# Patient Record
Sex: Female | Born: 1960 | Race: Black or African American | Hispanic: No | Marital: Married | Smoking: Never smoker
Health system: Southern US, Community
[De-identification: ages and names within clinical notes are randomized; demographics above are authoritative.]

## PROBLEM LIST (undated history)

## (undated) DIAGNOSIS — D649 Anemia, unspecified: Secondary | ICD-10-CM

## (undated) DIAGNOSIS — M797 Fibromyalgia: Secondary | ICD-10-CM

## (undated) DIAGNOSIS — K219 Gastro-esophageal reflux disease without esophagitis: Secondary | ICD-10-CM

## (undated) DIAGNOSIS — IMO0002 Reserved for concepts with insufficient information to code with codable children: Secondary | ICD-10-CM

## (undated) DIAGNOSIS — R112 Nausea with vomiting, unspecified: Secondary | ICD-10-CM

## (undated) DIAGNOSIS — Z9889 Other specified postprocedural states: Secondary | ICD-10-CM

## (undated) DIAGNOSIS — M329 Systemic lupus erythematosus, unspecified: Secondary | ICD-10-CM

## (undated) DIAGNOSIS — Z8489 Family history of other specified conditions: Secondary | ICD-10-CM

## (undated) HISTORY — PX: BACK SURGERY: SHX140

## (undated) HISTORY — PX: TONSILLECTOMY: SUR1361

## (undated) HISTORY — PX: KNEE SURGERY: SHX244

## (undated) HISTORY — PX: ABDOMINAL HYSTERECTOMY: SHX81

## (undated) HISTORY — DX: Gastro-esophageal reflux disease without esophagitis: K21.9

## (undated) HISTORY — PX: OTHER SURGICAL HISTORY: SHX169

## (undated) HISTORY — PX: CHOLECYSTECTOMY: SHX55

## (undated) HISTORY — PX: NECK SURGERY: SHX720

---

## 1998-11-16 ENCOUNTER — Ambulatory Visit (HOSPITAL_COMMUNITY): Admission: RE | Admit: 1998-11-16 | Discharge: 1998-11-16 | Payer: Self-pay | Admitting: Gastroenterology

## 1999-07-09 ENCOUNTER — Emergency Department (HOSPITAL_COMMUNITY): Admission: EM | Admit: 1999-07-09 | Discharge: 1999-07-09 | Payer: Self-pay | Admitting: Emergency Medicine

## 1999-07-10 ENCOUNTER — Encounter: Payer: Self-pay | Admitting: Emergency Medicine

## 1999-10-19 ENCOUNTER — Encounter: Payer: Self-pay | Admitting: Cardiology

## 1999-10-19 ENCOUNTER — Ambulatory Visit (HOSPITAL_COMMUNITY): Admission: RE | Admit: 1999-10-19 | Discharge: 1999-10-19 | Payer: Self-pay | Admitting: Cardiology

## 2002-04-15 ENCOUNTER — Encounter: Payer: Self-pay | Admitting: Cardiology

## 2002-04-15 ENCOUNTER — Encounter: Admission: RE | Admit: 2002-04-15 | Discharge: 2002-04-15 | Payer: Self-pay | Admitting: Cardiology

## 2002-04-22 ENCOUNTER — Encounter: Payer: Self-pay | Admitting: Cardiology

## 2002-04-22 ENCOUNTER — Ambulatory Visit (HOSPITAL_COMMUNITY): Admission: RE | Admit: 2002-04-22 | Discharge: 2002-04-22 | Payer: Self-pay | Admitting: Cardiology

## 2003-04-29 ENCOUNTER — Other Ambulatory Visit: Admission: RE | Admit: 2003-04-29 | Discharge: 2003-04-29 | Payer: Self-pay | Admitting: Obstetrics and Gynecology

## 2003-10-03 ENCOUNTER — Encounter: Admission: RE | Admit: 2003-10-03 | Discharge: 2003-10-03 | Payer: Self-pay | Admitting: Cardiology

## 2003-11-05 HISTORY — PX: GASTRIC BYPASS: SHX52

## 2004-09-17 ENCOUNTER — Other Ambulatory Visit: Admission: RE | Admit: 2004-09-17 | Discharge: 2004-09-17 | Payer: Self-pay | Admitting: Obstetrics and Gynecology

## 2006-11-04 DIAGNOSIS — S069X9A Unspecified intracranial injury with loss of consciousness of unspecified duration, initial encounter: Secondary | ICD-10-CM

## 2006-11-04 DIAGNOSIS — S069XAA Unspecified intracranial injury with loss of consciousness status unknown, initial encounter: Secondary | ICD-10-CM

## 2006-11-04 HISTORY — DX: Unspecified intracranial injury with loss of consciousness status unknown, initial encounter: S06.9XAA

## 2006-11-04 HISTORY — DX: Unspecified intracranial injury with loss of consciousness of unspecified duration, initial encounter: S06.9X9A

## 2018-12-19 ENCOUNTER — Other Ambulatory Visit: Payer: Self-pay

## 2018-12-19 ENCOUNTER — Emergency Department (HOSPITAL_COMMUNITY)
Admission: EM | Admit: 2018-12-19 | Discharge: 2018-12-19 | Disposition: A | Discharge status: Home / Self Care | Payer: Medicare Other | Attending: Emergency Medicine | Admitting: Emergency Medicine

## 2018-12-19 ENCOUNTER — Encounter: Payer: Self-pay | Admitting: Emergency Medicine

## 2018-12-19 ENCOUNTER — Emergency Department (HOSPITAL_COMMUNITY): Payer: Medicare Other

## 2018-12-19 DIAGNOSIS — R079 Chest pain, unspecified: Secondary | ICD-10-CM | POA: Diagnosis present

## 2018-12-19 DIAGNOSIS — R072 Precordial pain: Secondary | ICD-10-CM | POA: Insufficient documentation

## 2018-12-19 DIAGNOSIS — Z91040 Latex allergy status: Secondary | ICD-10-CM | POA: Insufficient documentation

## 2018-12-19 HISTORY — DX: Fibromyalgia: M79.7

## 2018-12-19 HISTORY — DX: Anemia, unspecified: D64.9

## 2018-12-19 HISTORY — DX: Systemic lupus erythematosus, unspecified: M32.9

## 2018-12-19 HISTORY — DX: Reserved for concepts with insufficient information to code with codable children: IMO0002

## 2018-12-19 LAB — COMPREHENSIVE METABOLIC PANEL
ALT: 14 U/L (ref 0–44)
AST: 26 U/L (ref 15–41)
Albumin: 3.7 g/dL (ref 3.5–5.0)
Alkaline Phosphatase: 134 U/L — ABNORMAL HIGH (ref 38–126)
Anion gap: 7 (ref 5–15)
BUN: 11 mg/dL (ref 6–20)
CALCIUM: 9 mg/dL (ref 8.9–10.3)
CO2: 24 mmol/L (ref 22–32)
Chloride: 107 mmol/L (ref 98–111)
Creatinine, Ser: 0.79 mg/dL (ref 0.44–1.00)
GFR calc Af Amer: >60 mL/min (ref >60)
GFR calc non Af Amer: >60 mL/min (ref >60)
Glucose, Bld: 102 mg/dL — ABNORMAL HIGH (ref 70–99)
Potassium: 4.6 mmol/L (ref 3.5–5.1)
Sodium: 138 mmol/L (ref 135–145)
Total Bilirubin: 0.8 mg/dL (ref 0.3–1.2)
Total Protein: 7.8 g/dL (ref 6.5–8.1)

## 2018-12-19 LAB — URINALYSIS, ROUTINE W REFLEX MICROSCOPIC
Bilirubin Urine: NEGATIVE
Glucose, UA: NEGATIVE mg/dL
Ketones, ur: NEGATIVE mg/dL
Leukocytes,Ua: NEGATIVE
Nitrite: NEGATIVE
Protein, ur: NEGATIVE mg/dL
Specific Gravity, Urine: 1.013 (ref 1.005–1.030)
pH: 6 (ref 5.0–8.0)

## 2018-12-19 LAB — CBC WITH DIFFERENTIAL/PLATELET
Abs Immature Granulocytes: 0.01 10*3/uL (ref 0.00–0.07)
Basophils Absolute: 0 10*3/uL (ref 0.0–0.1)
Basophils Relative: 1 %
EOS PCT: 3 %
Eosinophils Absolute: 0.1 10*3/uL (ref 0.0–0.5)
HCT: 44.9 % (ref 36.0–46.0)
Hemoglobin: 14.2 g/dL (ref 12.0–15.0)
Immature Granulocytes: 0 %
Lymphocytes Relative: 35 %
Lymphs Abs: 1.5 10*3/uL (ref 0.7–4.0)
MCH: 29.8 pg (ref 26.0–34.0)
MCHC: 31.6 g/dL (ref 30.0–36.0)
MCV: 94.3 fL (ref 80.0–100.0)
MONOS PCT: 8 %
Monocytes Absolute: 0.3 10*3/uL (ref 0.1–1.0)
NRBC: 0 % (ref 0.0–0.2)
Neutro Abs: 2.3 10*3/uL (ref 1.7–7.7)
Neutrophils Relative %: 53 %
Platelets: 186 10*3/uL (ref 150–400)
RBC: 4.76 MIL/uL (ref 3.87–5.11)
RDW: 12.8 % (ref 11.5–15.5)
WBC: 4.3 10*3/uL (ref 4.0–10.5)

## 2018-12-19 LAB — D-DIMER, QUANTITATIVE: D-Dimer, Quant: <0.27 ug/mL-FEU (ref 0.00–0.50)

## 2018-12-19 LAB — I-STAT TROPONIN, ED
Troponin i, poc: 0 ng/mL (ref 0.00–0.08)
Troponin i, poc: 0.02 ng/mL (ref 0.00–0.08)

## 2018-12-19 LAB — LIPASE, BLOOD: Lipase: 48 U/L (ref 11–51)

## 2018-12-19 MED ORDER — ASPIRIN 81 MG PO CHEW
324.0000 mg | CHEWABLE_TABLET | Freq: Once | ORAL | Status: AC
  Administered 2018-12-19: 324 mg via ORAL
  Filled 2018-12-19: qty 4

## 2018-12-19 MED ORDER — OMEPRAZOLE 20 MG PO CPDR: 20.0000 mg | DELAYED_RELEASE_CAPSULE | Freq: Every day | ORAL | 0 refills | Status: DC

## 2018-12-19 MED ORDER — LIDOCAINE VISCOUS HCL 2 % MT SOLN
15.0000 mL | Freq: Once | OROMUCOSAL | Status: AC
  Administered 2018-12-19: 15 mL via ORAL
  Filled 2018-12-19: qty 15

## 2018-12-19 MED ORDER — ALUM & MAG HYDROXIDE-SIMETH 200-200-20 MG/5ML PO SUSP
30.0000 mL | Freq: Once | ORAL | Status: AC
  Administered 2018-12-19: 30 mL via ORAL
  Filled 2018-12-19: qty 30

## 2018-12-19 NOTE — Discharge Instructions (Signed)
Your work-up today was overall reassuring.  The blood clot test was negative.  The chest x-ray was reassuring.  Your heart enzymes were negative x2.  Please follow-up with your primary doctor and start taking the Prilosec to help with the reflux we suspect you have.  Please avoid those jalapenos and other spicy foods.  If any symptoms change or worsen, please return to the nearest emergency department.

## 2018-12-19 NOTE — ED Triage Notes (Signed)
Pt presents with c/o centralized chest pain that radiates up the left side of her neck. Pt endorses an episode of "fluttering" in her chest yesterday. Pt endorses pain with swallowing and frequent and painful burps. Pt also endorses intermittent dizziness today.

## 2018-12-19 NOTE — ED Notes (Signed)
Patient verbalizes understanding of discharge instructions. Opportunity for questioning and answers were provided. Armband removed by staff, pt discharged from ED.  

## 2018-12-19 NOTE — ED Provider Notes (Signed)
[facility] EMERGENCY DEPARTMENT Provider Note   CSN: [id number] Arrival date & time: 12/19/18  1116     History   Chief Complaint Chief Complaint  Patient presents with  . Chest Pain    HPI Tara Austin is a 58 y.o. female.  The history is provided by the patient.  Chest Pain  Pain location:  Substernal area Pain quality: aching, burning and sharp   Pain radiates to:  Neck Pain severity:  Moderate Onset quality:  Gradual Duration:  2 days Timing:  Constant Progression:  Waxing and waning Context: breathing and eating   Relieved by:  Nothing Worsened by:  Coughing and deep breathing Ineffective treatments:  None tried Associated symptoms: heartburn, palpitations and shortness of breath   Associated symptoms: no abdominal pain, no altered mental status, no back pain, no cough, no diaphoresis, no fatigue, no fever, no headache, no nausea, no numbness, no syncope, no vomiting and no weakness     Past Medical History:  Diagnosis Date  . Anemia   . Fibromyalgia   . Lupus ([place])     There are no active problems to display for this patient.   Past Surgical History:  Procedure Laterality Date  . ABDOMINAL HYSTERECTOMY     partial     OB History   No obstetric history on file.      Home Medications    Prior to Admission medications   Not on File    Family History No family history on file.  Social History Social History   Tobacco Use  . Smoking status: Not on file  Substance Use Topics  . Alcohol use: Not on file  . Drug use: Not on file     Allergies   Latex   Review of Systems Review of Systems  Constitutional: Negative for chills, diaphoresis, fatigue and fever.  Eyes: Negative for visual disturbance.  Respiratory: Positive for shortness of breath. Negative for cough, choking, chest tightness, wheezing and stridor.   Cardiovascular: Positive for chest pain and palpitations. Negative for leg swelling and syncope.    Gastrointestinal: Positive for heartburn. Negative for abdominal pain, constipation, diarrhea, nausea and vomiting.  Genitourinary: Negative for flank pain.  Musculoskeletal: Negative for back pain.  Skin: Negative for rash and wound.  Neurological: Negative for weakness, light-headedness, numbness and headaches.  Psychiatric/Behavioral: Negative for agitation and confusion.  All other systems reviewed and are negative.    Physical Exam Updated Vital Signs BP (!) 158/91 (BP Location: Left Arm)   Pulse 73   Temp 98.1 F (36.7 C) (Oral)   Resp (!) 23   Ht '5\' 7"'$  (1.702 m)   Wt 79.8 kg   SpO2 98%   BMI 27.57 kg/m   Physical Exam Vitals signs and nursing note reviewed.  Constitutional:      General: She is not in acute distress.    Appearance: She is well-developed. She is not ill-appearing, toxic-appearing or diaphoretic.  HENT:     Head: Normocephalic and atraumatic.  Eyes:     Conjunctiva/sclera: Conjunctivae normal.     Pupils: Pupils are equal, round, and reactive to light.  Neck:     Musculoskeletal: Neck supple.  Cardiovascular:     Rate and Rhythm: Normal rate and regular rhythm.     Heart sounds: No murmur.  Pulmonary:     Effort: Pulmonary effort is normal. No respiratory distress.     Breath sounds: Normal breath sounds. No decreased breath sounds, wheezing, rhonchi or  rales.  Chest:     Chest wall: Tenderness present.  Abdominal:     Palpations: Abdomen is soft.     Tenderness: There is no abdominal tenderness.  Musculoskeletal:     Right lower leg: She exhibits no tenderness. No edema.     Left lower leg: She exhibits no tenderness. No edema.  Skin:    General: Skin is warm and dry.     Capillary Refill: Capillary refill takes less than 2 seconds.  Neurological:     General: No focal deficit present.     Mental Status: She is alert.  Psychiatric:        Mood and Affect: Mood normal.      ED Treatments / Results  Labs (all labs ordered are  listed, but only abnormal results are displayed) Labs Reviewed  COMPREHENSIVE METABOLIC PANEL - Abnormal; Notable for the following components:      Result Value   Glucose, Bld 102 (*)    Alkaline Phosphatase 134 (*)    All other components within normal limits  URINALYSIS, ROUTINE W REFLEX MICROSCOPIC - Abnormal; Notable for the following components:   Hgb urine dipstick MODERATE (*)    Bacteria, UA RARE (*)    All other components within normal limits  CBC WITH DIFFERENTIAL/PLATELET  LIPASE, BLOOD  D-DIMER, QUANTITATIVE (NOT AT [facility])  I-STAT TROPONIN, ED  I-STAT TROPONIN, ED    EKG EKG Interpretation  Date/Time:  Saturday December 19 2018 11:28:20 EST Ventricular Rate:  74 PR Interval:    QRS Duration: 89 QT Interval:  419 QTC Calculation: 465 R Axis:   35 Text Interpretation:  Sinus rhythm Abnormal R-wave progression, early transition Baseline wander in lead(s) II aVF No prior ECG for comparison.  No STEMI Confirmed by Antony Blackbird [phone]) on 12/19/2018 11:33:55 AM   Radiology Dg Chest 2 View  Result Date: 12/19/2018 CLINICAL DATA:  Pt presents with c/o centralized chest pain that radiates up the left side of her neck. Pt endorses an episode of "fluttering" in her chest yesterday. Pt endorses pain with swallowing and frequent and painful burps. Patient also reports dizziness today. History of anemia. EXAM: CHEST - 2 VIEW COMPARISON:  None. FINDINGS: Normal heart, mediastinum and hila. The lungs are clear.  No pleural effusion or pneumothorax. Skeletal structures are unremarkable. IMPRESSION: No active cardiopulmonary disease. Electronically Signed   By: Lajean Manes M.D.   On: 12/19/2018 13:06    Procedures Procedures (including critical care time)  Medications Ordered in ED Medications  alum & mag hydroxide-simeth (MAALOX/MYLANTA) 200-200-20 MG/5ML suspension 30 mL (30 mLs Oral Given 12/19/18 1158)    And  lidocaine (XYLOCAINE) 2 % viscous mouth solution 15 mL (15 mLs  Oral Given 12/19/18 1158)  aspirin chewable tablet 324 mg (324 mg Oral Given 12/19/18 1158)     Initial Impression / Assessment and Plan / ED Course  I have reviewed the triage vital signs and the nursing notes.  Pertinent labs & imaging results that were available during my care of the patient were reviewed by me and considered in my medical decision making (see chart for details).     Tara Austin is a 58 y.o. female with a past medical history significant for fibromyalgia, lupus, and anemia who presents with chest pain for the last 2 days.  Patient reports that yesterday she began having pain in her chest that started.  It radiates towards her neck.  She reports palpitations and fluttering sensation.  She reports shortness  of breath.  She reports it is very pleuritic.  It is not exertional.  She reports associated diaphoresis but no nausea or vomiting.  She is never had this before.  She does report shortness of breath with it.  She reports the pain gets up to 7 out of 10 in severity and is currently slightly improved.  She reports no leg pain or leg swelling.  She does report some chills but minimal cough.  She reports that her mother had early heart attack and she is concerned about cardiac disease.  She does report that she is a history of reflux and ate jalapenos the other day.  On exam, chest is slightly tender to palpation reproducing her discomfort.  No stridor.  Lungs clear.  Abdomen nontender.  Back nontender.  Legs have normal pulses and are nonedematous.  Arms have normal pulses.  No focal neurologic deficits.  EKG shows no STEMI.  Clinically I suspect patient is having a musculoskeletal or reflux type pain after jalapenos.  However, given patient's description of symptoms, will have work-up to look for pulmonary embolism or cardiac etiology of symptoms.  Anticipate reassessment after work-up.  Patient's work-up was reassuring.  Delta troponin was negative.  Urinalysis shows  no infection.  CBC and CMP reassuring.  She reports he does not have a gallbladder and I suspect the alk phos was an acute phase reactant.  Chest x-ray reassuring.  Clinically I suspect the patient symptoms are related to the jalapenos he ingested worsening her prior reflux.  She reports that GI cocktail significantly helped her discomfort.  Patient be started on Prilosec and follow-up with her PCP.  She understand extremely strict return precautions for new or worsened symptoms.  Patient had no other questions or concerns and was discharged in good condition with improved symptoms.  Final Clinical Impressions(s) / ED Diagnoses   Final diagnoses:  Precordial pain  Nonspecific chest pain    ED Discharge Orders         Ordered    omeprazole (PRILOSEC) 20 MG capsule  Daily     12/19/18 1548          Clinical Impression: 1. Precordial pain   2. Nonspecific chest pain     Disposition: Discharge  Condition: Good  I have discussed the results, Dx and Tx plan with the pt(& family if present). He/she/they expressed understanding and agree(s) with the plan. Discharge instructions discussed at great length. Strict return precautions discussed and pt &/or family have verbalized understanding of the instructions. No further questions at time of discharge.    New Prescriptions   OMEPRAZOLE (PRILOSEC) 20 MG CAPSULE    Take 1 capsule (20 mg total) by mouth daily.    Follow Up: [place] [id number] [phone] Schedule an appointment as soon as possible for a visit    [facility] [address] [phone] mc [place]       Tegeler, Gwenyth Allegra, MD 12/19/18 1945

## 2019-10-05 ENCOUNTER — Other Ambulatory Visit: Payer: Self-pay | Admitting: Family

## 2019-10-05 DIAGNOSIS — Z1231 Encounter for screening mammogram for malignant neoplasm of breast: Secondary | ICD-10-CM

## 2019-10-12 ENCOUNTER — Telehealth: Payer: Self-pay

## 2019-10-12 ENCOUNTER — Ambulatory Visit: Payer: Medicare Other | Admitting: Neurology

## 2019-10-12 NOTE — Telephone Encounter (Signed)
Pt did not show for their appt with Dr. Athar today.  

## 2019-10-19 ENCOUNTER — Encounter: Payer: Self-pay | Admitting: Neurology

## 2019-11-09 ENCOUNTER — Ambulatory Visit: Payer: Medicare Other | Admitting: Neurology

## 2019-11-09 ENCOUNTER — Telehealth: Payer: Self-pay

## 2019-11-09 NOTE — Telephone Encounter (Signed)
PT had appt pn 11/09/2019 and cancel on 11/08/2019. This appt is a no show. Pt reschedule for 12/16/2019. IF pt cancels or no show less than 24 hours please do not r/s pt. ITs for a new pt appt.

## 2019-11-25 ENCOUNTER — Ambulatory Visit: Payer: Medicare Other

## 2019-12-16 ENCOUNTER — Ambulatory Visit: Payer: Medicare Other | Admitting: Neurology

## 2019-12-16 ENCOUNTER — Encounter: Payer: Self-pay | Admitting: Neurology

## 2019-12-16 ENCOUNTER — Other Ambulatory Visit: Payer: Self-pay

## 2019-12-16 VITALS — BP 111/80 | HR 69 | Ht 66.0 in | Wt 195.3 lb

## 2019-12-16 DIAGNOSIS — M542 Cervicalgia: Secondary | ICD-10-CM

## 2019-12-16 DIAGNOSIS — G43019 Migraine without aura, intractable, without status migrainosus: Secondary | ICD-10-CM

## 2019-12-16 DIAGNOSIS — M5431 Sciatica, right side: Secondary | ICD-10-CM | POA: Diagnosis not present

## 2019-12-16 DIAGNOSIS — G8929 Other chronic pain: Secondary | ICD-10-CM

## 2019-12-16 MED ORDER — RIZATRIPTAN BENZOATE 10 MG PO TBDP: 10.0000 mg | ORAL_TABLET | ORAL | 5 refills | Status: AC | PRN

## 2019-12-16 NOTE — Progress Notes (Signed)
Subjective:    Patient ID: Tara Austin is a 59 y.o. female.  HPI     Huston Foley, MD, PhD [facility] [address] [address] [place], [place] [address]  Dear Tara Austin,   I saw your patient, [name], upon your kind request in my neurologic clinic today for initial consultation of her recurrent headaches.  The patient is unaccompanied today.  Of note, she missed an appointment on 10/12/2019 as well as 11/09/2019. As you know, [name] is a 59 year old right-handed woman with an underlying medical history of hiatal hernia, reflux disease, history of fibromyalgia, history of lupus, chronic migraines, remote history of traumatic brain injury by chart report, status post spinal surgery, chronic neck pain, vitamin D deficiency, obesity, depression and insomnia, who reports recurrent headaches for the past 12+ years.  She describes one-sided throbbing headaches, associated with photophobia, lying down helps, sometimes she has nausea and vomiting but not every time.  She has a family history of migraines, mother has migraines and her daughter has them.  The patient is separated, moved from [place] in August 2019.  She is on disability, she also has her own travel agency.  She is a non-smoker and drinks alcohol on special occasions only and very limited caffeine, typically only 1 cup of coffee per day.  She tries to hydrate well with water. She has been referred to rheumatology, since her move from [place] she has not seen a rheumatologist, she used to be on Plaquenil for her lupus.  For her migraines she was started on Topamax by her rheumatologist.  She used to take Maxalt under the tongue with reasonably good success and would be willing to try it again.  Topamax has been helpful.  She currently takes 100 mg once daily.  She has had some chronic neck pain, had seen a neurosurgeon in the past, has not seen a spine specialist here yet.  She does have low back  pain as well and radiating pain to the right leg from time to time.   Migraine triggers include weather changes, dehydration, and sleep deprivation.  Some years ago she had a sleep study which did not show any sleep apnea per her report.   I reviewed your office note from 10/04/2019.  She has previously seen a neurologist.  She has been on Topamax for migraine prevention.   Currently she is on Lyrica 75 mg twice daily, Zanaflex 3 times a day as needed, she is on Effexor long-acting 150 mg daily, Xanax as needed, hydroxyzine at bedtime.  She no longer takes Flexeril or gabapentin.  She is status post gastric bypass surgery for obesity and has had some weight fluctuations, recent weight gain.   She reports that she is due for an eye examination, she last saw an eye doctor about 2 years ago.  Her Past Medical History Is Significant For: Past Medical History:  Diagnosis Date  . Anemia   . Brain injury ([facility])   . Fibromyalgia   . GERD (gastroesophageal reflux disease)   . Lupus ([facility])     Her Past Surgical History Is Significant For: Past Surgical History:  Procedure Laterality Date  . ABDOMINAL HYSTERECTOMY     partial  . BACK SURGERY     x2   . CHOLECYSTECTOMY    . GASTRIC BYPASS  2005  . KNEE SURGERY     x2   . NECK SURGERY    . shoulder     right   .  TONSILLECTOMY      Her Family History Is Significant For: Family History  Problem Relation Age of Onset  . Heart Problems Mother   . Heart attack Mother   . Thyroid disease Mother   . Migraines Mother   . Asthma Mother   . High blood pressure Mother   . Diabetes Mother   . High blood pressure Father   . Arthritis Father     Her Social History Is Significant For: Social History   Socioeconomic History  . Marital status: Married    Spouse name: Not on file  . Number of children: Not on file  . Years of education: Not on file  . Highest education level: Not on file  Occupational History  . Not on file  Tobacco Use  .  Smoking status: Never Smoker  . Smokeless tobacco: Never Used  Substance and Sexual Activity  . Alcohol use: Yes    Comment: Occational glass of wine   . Drug use: Never  . Sexual activity: Not on file  Other Topics Concern  . Not on file  Social History Narrative  . Not on file   Social Determinants of Health   Financial Resource Strain:   . Difficulty of Paying Living Expenses: Not on file  Food Insecurity:   . Worried About Programme researcher, broadcasting/film/video in the Last Year: Not on file  . Ran Out of Food in the Last Year: Not on file  Transportation Needs:   . Lack of Transportation (Medical): Not on file  . Lack of Transportation (Non-Medical): Not on file  Physical Activity:   . Days of Exercise per Week: Not on file  . Minutes of Exercise per Session: Not on file  Stress:   . Feeling of Stress : Not on file  Social Connections:   . Frequency of Communication with Friends and Family: Not on file  . Frequency of Social Gatherings with Friends and Family: Not on file  . Attends Religious Services: Not on file  . Active Member of Clubs or Organizations: Not on file  . Attends Banker Meetings: Not on file  . Marital Status: Not on file    Her Allergies Are:  Allergies  Allergen Reactions  . Contrast Media [Iodinated Diagnostic Agents] Hives  . Latex Hives  :   Her Current Medications Are:  Outpatient Encounter Medications as of 12/16/2019  Medication Sig  . ALPRAZolam (XANAX) 0.25 MG tablet Take 0.25 mg by mouth daily as needed for anxiety.  . hydrOXYzine (ATARAX/VISTARIL) 25 MG tablet Take 25 mg by mouth at bedtime.  . pantoprazole (PROTONIX) 40 MG tablet Take 40 mg by mouth daily.  . pregabalin (LYRICA) 75 MG capsule Take 75 mg by mouth 2 (two) times daily.  Marland Kitchen tiZANidine (ZANAFLEX) 4 MG tablet Take 4 mg by mouth every 8 (eight) hours as needed for muscle spasms.  Marland Kitchen topiramate (TOPAMAX) 100 MG tablet Take 100 mg by mouth daily.  Marland Kitchen venlafaxine XR (EFFEXOR XR) 150  MG 24 hr capsule Take 150 mg by mouth daily with breakfast.   . Vitamin D, Ergocalciferol, (DRISDOL) 1.25 MG (50000 UT) CAPS capsule Take 50,000 Units by mouth every 7 (seven) days.   . rizatriptan (MAXALT-MLT) 10 MG disintegrating tablet Take 1 tablet (10 mg total) by mouth as needed for migraine. May repeat in 2 hours if needed  . [DISCONTINUED] cyclobenzaprine (FLEXERIL) 10 MG tablet Take 10 mg by mouth 3 (three) times daily as needed for muscle  spasms.  . [DISCONTINUED] gabapentin (NEURONTIN) 100 MG capsule Take 100 mg by mouth 3 (three) times daily.   . [DISCONTINUED] omeprazole (PRILOSEC) 20 MG capsule Take 1 capsule (20 mg total) by mouth daily. (Patient not taking: Reported on 12/16/2019)   No facility-administered encounter medications on file as of 12/16/2019.  : Review of Systems:  Out of a complete 14 point review of systems, all are reviewed and negative with the exception of these symptoms as listed below:  Review of Systems  Neurological:       Here to discuss worsening migraines, neck pain and nerve pain in right leg.     Objective:  Neurological Exam  Physical Exam Physical Examination:   Vitals:   12/16/19 1433  BP: 111/80  Pulse: 69    General Examination: The patient is a very pleasant 59 y.o. female in no acute distress. She appears well-developed and well-nourished and well groomed.   HEENT: Normocephalic, atraumatic, pupils are equal, round and reactive to light and accommodation. Funduscopic exam is normal with sharp disc margins noted. Extraocular tracking is good without limitation to gaze excursion or nystagmus noted. Normal smooth pursuit is noted. Hearing is grossly intact. Face is symmetric with normal facial animation and normal facial sensation. Speech is clear with no dysarthria noted. There is no hypophonia. There is no lip, neck/head, jaw or voice tremor. Neck is supple with full range of passive and active motion. There are no carotid bruits on  auscultation. Oropharynx exam reveals: mild mouth dryness, adequate dental hygiene. Tongue protrudes centrally and palate elevates symmetrically.  Chest: Clear to auscultation without wheezing, rhonchi or crackles noted.  Heart: S1+S2+0, regular and normal without murmurs, rubs or gallops noted.   Abdomen: Soft, non-tender and non-distended with normal bowel sounds appreciated on auscultation.  Extremities: There is no pitting edema in the distal lower extremities bilaterally. Pedal pulses are intact.  Skin: Warm and dry without trophic changes noted.  Musculoskeletal: exam reveals no obvious joint deformities, tenderness or joint swelling or erythema.   Neurologically:  Mental status: The patient is awake, alert and oriented in all 4 spheres. Her immediate and remote memory, attention, language skills and fund of knowledge are appropriate. There is no evidence of aphasia, agnosia, apraxia or anomia. Speech is clear with normal prosody and enunciation. Thought process is linear. Mood is normal and affect is normal.  Cranial nerves II - XII are as described above under HEENT exam. In addition: shoulder shrug is normal with equal shoulder height noted. Motor exam: Normal bulk, strength and tone is noted. There is no drift, tremor or rebound. Romberg is negative. Reflexes are 2+ throughout. Babinski: Toes are flexor bilaterally. Fine motor skills and coordination: intact with normal finger taps, normal hand movements, normal rapid alternating patting, normal foot taps and normal foot agility.  Cerebellar testing: No dysmetria or intention tremor on finger to nose testing. Heel to shin is unremarkable bilaterally. There is no truncal or gait ataxia.  Sensory exam: intact to light touch, vibration, temperature sense in the upper and lower extremities.  Gait, station and balance: She stands easily. No veering to one side is noted. No leaning to one side is noted. Posture is age-appropriate and stance  is narrow based. Gait shows normal stride length and normal pace. No problems turning are noted. Tandem walk is unremarkable.            Assessment and Plan:  In summary, YLIANA GRAVOIS is a very pleasant 59 y.o.-year old  female with an underlying medical history of hiatal hernia, reflux disease, history of fibromyalgia, history of lupus, chronic migraines, remote history of traumatic brain injury by chart review, status post spinal surgery, chronic neck pain, vitamin D deficiency, obesity, depression and insomnia, who Presents for evaluation of her migraine headaches, she has a several year history of recurrent migraines, current frequency is about 1 week, she has been on Topamax for several years, has done overall quite well with it.  She had seen a neurologist in [facility] and also a rheumatologist and is currently in the process of seeing a new rheumatologist after she moved from [place] to [place].She has a history of neck pain and had neck surgery.  She had seen a neurosurgeon in the past.  She has had some ongoing neck pain and also reports low back pain with occasional radiation to the right leg.  Neurological exam is nonfocal and reassuring today.  I do not believe she needs any new imaging from my end of things.  I think she may benefit from seeing a spine specialist, she is encouraged to talk to you about it.  She is encouraged to get a formal eye examination done.  She did well with Maxalt in the past as needed and we mutually agreed to restart this.For migraine prevention she can continue with Topamax . At the current dose.  She is advised to follow-up routinely with one of our nurse practitioners in 3 months, we talked about headache triggers as well today.  She is advised to stay well-hydrated and well rested.  She is also working on weight loss. I answered all her questions today and she was in agreement.   Thank you very much for allowing me to participate in the care of  this nice patient. If I can be of any further assistance to you please do not hesitate to call me at [phone].  Sincerely,   Huston Foley, MD, PhD

## 2019-12-16 NOTE — Patient Instructions (Addendum)
It sounds like you have ongoing issues with neck pain and sometimes your neck pain triggers her migraines.  You also have had some sciatica type symptoms, I do believe you will benefit from establishing with a spine specialist, you used to see a neurosurgeon in [place], please talk to Fatima Sanger about a referral to a spine specialist such as neurosurgeon or orthopedic surgeon in your area. You have done fairly well with Topamax daily for migraine prevention, you can continue with your prescription.  You have done well in the past with Maxalt under the tongue, I will prescribe Maxalt MLT 10 mg strength as needed. I do not believe you need a brain MRI, you have had brain scans before.  We will try to get records from your neurologist from [place], [place].  I do believe you will benefit from a formal eye examination as it has been a couple of years since her last eye exam.  Please schedule with an eye doctor of your choice.  Please follow-up routinely to see one of our nurse practitioners in 3 months.

## 2020-01-06 ENCOUNTER — Other Ambulatory Visit: Payer: Self-pay | Admitting: Obstetrics and Gynecology

## 2020-01-06 DIAGNOSIS — R928 Other abnormal and inconclusive findings on diagnostic imaging of breast: Secondary | ICD-10-CM

## 2020-01-12 ENCOUNTER — Other Ambulatory Visit: Payer: Self-pay | Admitting: Obstetrics and Gynecology

## 2020-01-12 ENCOUNTER — Ambulatory Visit
Admission: RE | Admit: 2020-01-12 | Discharge: 2020-01-12 | Disposition: A | Discharge status: Home / Self Care | Payer: Medicare Other | Source: Ambulatory Visit | Attending: Obstetrics and Gynecology | Admitting: Obstetrics and Gynecology

## 2020-01-12 ENCOUNTER — Other Ambulatory Visit: Payer: Self-pay

## 2020-01-12 DIAGNOSIS — R928 Other abnormal and inconclusive findings on diagnostic imaging of breast: Secondary | ICD-10-CM

## 2020-01-12 DIAGNOSIS — N6489 Other specified disorders of breast: Secondary | ICD-10-CM

## 2020-02-11 ENCOUNTER — Encounter (HOSPITAL_COMMUNITY): Payer: Self-pay | Admitting: Emergency Medicine

## 2020-02-11 ENCOUNTER — Other Ambulatory Visit: Payer: Self-pay

## 2020-02-11 ENCOUNTER — Emergency Department (HOSPITAL_COMMUNITY)
Admission: EM | Admit: 2020-02-11 | Discharge: 2020-02-11 | Disposition: A | Discharge status: Home / Self Care | Payer: Medicare Other | Attending: Emergency Medicine | Admitting: Emergency Medicine

## 2020-02-11 DIAGNOSIS — R109 Unspecified abdominal pain: Secondary | ICD-10-CM | POA: Diagnosis not present

## 2020-02-11 DIAGNOSIS — Z5321 Procedure and treatment not carried out due to patient leaving prior to being seen by health care provider: Secondary | ICD-10-CM | POA: Insufficient documentation

## 2020-02-11 LAB — COMPREHENSIVE METABOLIC PANEL
ALT: 200 U/L — ABNORMAL HIGH (ref 0–44)
AST: 571 U/L — ABNORMAL HIGH (ref 15–41)
Albumin: 3.6 g/dL (ref 3.5–5.0)
Alkaline Phosphatase: 191 U/L — ABNORMAL HIGH (ref 38–126)
Anion gap: 12 (ref 5–15)
BUN: 16 mg/dL (ref 6–20)
CO2: 24 mmol/L (ref 22–32)
Calcium: 8.9 mg/dL (ref 8.9–10.3)
Chloride: 107 mmol/L (ref 98–111)
Creatinine, Ser: 0.8 mg/dL (ref 0.44–1.00)
GFR calc Af Amer: >60 mL/min (ref >60)
GFR calc non Af Amer: >60 mL/min (ref >60)
Glucose, Bld: 102 mg/dL — ABNORMAL HIGH (ref 70–99)
Potassium: 4 mmol/L (ref 3.5–5.1)
Sodium: 143 mmol/L (ref 135–145)
Total Bilirubin: 0.7 mg/dL (ref 0.3–1.2)
Total Protein: 7.7 g/dL (ref 6.5–8.1)

## 2020-02-11 LAB — URINALYSIS, ROUTINE W REFLEX MICROSCOPIC
Bilirubin Urine: NEGATIVE
Glucose, UA: NEGATIVE mg/dL
Hgb urine dipstick: NEGATIVE
Ketones, ur: NEGATIVE mg/dL
Leukocytes,Ua: NEGATIVE
Nitrite: NEGATIVE
Protein, ur: 30 mg/dL — AB
Specific Gravity, Urine: 1.023 (ref 1.005–1.030)
pH: 8 (ref 5.0–8.0)

## 2020-02-11 LAB — CBC
HCT: 43.8 % (ref 36.0–46.0)
Hemoglobin: 13.6 g/dL (ref 12.0–15.0)
MCH: 29.9 pg (ref 26.0–34.0)
MCHC: 31.1 g/dL (ref 30.0–36.0)
MCV: 96.3 fL (ref 80.0–100.0)
Platelets: 216 10*3/uL (ref 150–400)
RBC: 4.55 MIL/uL (ref 3.87–5.11)
RDW: 13.6 % (ref 11.5–15.5)
WBC: 5.9 10*3/uL (ref 4.0–10.5)
nRBC: 0 % (ref 0.0–0.2)

## 2020-02-11 LAB — LIPASE, BLOOD: Lipase: 46 U/L (ref 11–51)

## 2020-02-11 MED ORDER — SODIUM CHLORIDE 0.9% FLUSH: 3.0000 mL | Freq: Once | INTRAVENOUS | Status: DC

## 2020-02-11 NOTE — ED Notes (Signed)
Pt stated that the wait was too long and left.

## 2020-02-11 NOTE — ED Triage Notes (Signed)
Patient reports emesis x2 with upper/mid abdominal pain this afternoon after taking Tylenol with Codeine for her root canal pain . No fever or diarrhea . Patient suspects medication side effect . Airway intact/respirations unlabored.

## 2020-03-01 NOTE — H&P (Signed)
Subjective:     Patient ID: Tara Austin is a 59 y.o. female.  Follow-up  Here for follow up discussion prior to planned panniculectomy. Highest weight 255 lb. Underwent gastric bypass through [facility] 2006. Lowest weight 171 lb. Stable within 10 lbs of current for over year. Reports starting 2011 rashes beneath skin fold abdomen. PMH significant for significant trauma with shelving falling on her in [place] and underwent spinal surgery as a result. Following spinal surgery she first developed symptoms. Has tried guaze, baking soda, hygiene measures, topical nystatin and Nizoral and oral antibiotics. Despite this reports occurrence rashes on average every 2 months, worse in warm weather. Reports excoriations/ulcerations skin even in absence of infection due to traumatized skin. Reports interferes with activities such as exercise, walking, bending, housework.  Patient lives alone. Has family and friend in area to assist with care post op. Disabled secondary to above accident with resultant pain, HA. 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  PMH includes fibromyalgia, lupus her Rheumatologist is in [facility] where she previously lived. Was taken off Plaquenil 3 years ago and stable off this.  Review of Systems    Objective:   Physical Exam  Cardiovascular: Normal rate, regular rhythm and normal heart sounds.  Pulmonary/Chest: Effort normal and breath sounds normal.  Abdominal: Soft.  Panniculus present that hangs below pubic bone, excoriation present beneath fold  Multiple laparoscopic scars, no hernia  Skin:  Luan Pulling 6       Assessment:     Panniculitis S/p gastric bypass    Plan:     Recurrent panniculitis following weight loss that has failed conservative measuresincluding antifungal and antibacterial and steroid treatment. Functional impairment of benign, walking, housework reported. Weight stable >12 months following bariatric  surgery.Panniculectomy is expected to reasonably control this problem.   Reviewedpanniculectomy is not a cosmetic procedure. Reviewed changes with aging, wt gain/loss, will not have as taught skin given significant loss elasticity. Reviewed incisions. Counseled will receive some elevation mons with procedure but will not affect contour of this. Reviewed supraumbilical soft tissue thickness/protuberance  will not change with panniculectomy and some patients experience increase in pant size following operation due to this area.Reviewed overnight stay, drains, post op limitations.   Additional risks including but not limited to bleeding, hematoma, seroma, damage to adjacent structures, blood clots in legs or lungs reviewed.    Discussed risk COVID infectionthrough this elective surgery. Patient will receive COVID testing prior to surgery. Discussed even if patient receivesa negative test result, the tests in some cases may fail to detect the virus or patient maycontract COVID after the test.COVID 19 infectionbefore/during/aftersurgery may result in lead to a higher chance of complication and death.  Rx for oxycodone given. Drain teaching done.

## 2020-03-10 ENCOUNTER — Encounter (HOSPITAL_BASED_OUTPATIENT_CLINIC_OR_DEPARTMENT_OTHER): Payer: Self-pay | Admitting: Plastic Surgery

## 2020-03-10 ENCOUNTER — Other Ambulatory Visit: Payer: Self-pay

## 2020-03-14 ENCOUNTER — Other Ambulatory Visit (HOSPITAL_COMMUNITY)
Admission: RE | Admit: 2020-03-14 | Discharge: 2020-03-14 | Disposition: A | Discharge status: Home / Self Care | Payer: Medicare Other | Source: Ambulatory Visit | Attending: Plastic Surgery | Admitting: Plastic Surgery

## 2020-03-14 DIAGNOSIS — Z01812 Encounter for preprocedural laboratory examination: Secondary | ICD-10-CM | POA: Diagnosis present

## 2020-03-14 DIAGNOSIS — Z20822 Contact with and (suspected) exposure to covid-19: Secondary | ICD-10-CM | POA: Diagnosis not present

## 2020-03-14 LAB — SARS CORONAVIRUS 2 (TAT 6-24 HRS): SARS Coronavirus 2: NEGATIVE

## 2020-03-14 NOTE — Progress Notes (Signed)
Pt arrived for surgical wash dispensed. Instructions given and pt verbalized understanding of usage

## 2020-03-16 ENCOUNTER — Ambulatory Visit: Payer: Medicare Other | Admitting: Family Medicine

## 2020-03-16 NOTE — Anesthesia Preprocedure Evaluation (Addendum)
Anesthesia Evaluation  Patient identified by MRN, date of birth, ID band Patient awake    Reviewed: Allergy & Precautions, NPO status , Patient's Chart, lab work & pertinent test results  History of Anesthesia Complications (+) PONV  Airway Mallampati: II  TM Distance: >3 FB Neck ROM: Full    Dental no notable dental hx. (+) Teeth Intact, Dental Advisory Given   Pulmonary neg pulmonary ROS,    Pulmonary exam normal breath sounds clear to auscultation       Cardiovascular negative cardio ROS Normal cardiovascular exam Rhythm:Regular Rate:Normal     Neuro/Psych  Neuromuscular disease negative neurological ROS  negative psych ROS   GI/Hepatic Neg liver ROS, GERD  Medicated,  Endo/Other  negative endocrine ROS  Renal/GU negative Renal ROS     Musculoskeletal  (+) Arthritis , Fibromyalgia -  Abdominal   Peds  Hematology   Anesthesia Other Findings   Reproductive/Obstetrics                            Anesthesia Physical Anesthesia Plan  ASA: II  Anesthesia Plan: General   Post-op Pain Management:    Induction: Intravenous  PONV Risk Score and Plan: 4 or greater and Treatment may vary due to age or medical condition, Ondansetron and Dexamethasone  Airway Management Planned: Oral ETT  Additional Equipment: None  Intra-op Plan:   Post-operative Plan: Extubation in OR  Informed Consent: I have reviewed the patients History and Physical, chart, labs and discussed the procedure including the risks, benefits and alternatives for the proposed anesthesia with the patient or authorized representative who has indicated his/her understanding and acceptance.     Dental advisory given  Plan Discussed with: CRNA  Anesthesia Plan Comments:        Anesthesia Quick Evaluation

## 2020-03-17 ENCOUNTER — Other Ambulatory Visit: Payer: Self-pay

## 2020-03-17 ENCOUNTER — Ambulatory Visit (HOSPITAL_BASED_OUTPATIENT_CLINIC_OR_DEPARTMENT_OTHER): Payer: Medicare Other | Admitting: Anesthesiology

## 2020-03-17 ENCOUNTER — Encounter (HOSPITAL_BASED_OUTPATIENT_CLINIC_OR_DEPARTMENT_OTHER): Payer: Self-pay | Admitting: Plastic Surgery

## 2020-03-17 ENCOUNTER — Ambulatory Visit (HOSPITAL_BASED_OUTPATIENT_CLINIC_OR_DEPARTMENT_OTHER)
Admission: RE | Admit: 2020-03-17 | Discharge: 2020-03-18 | Disposition: A | Discharge status: Home / Self Care | Payer: Medicare Other | Attending: Plastic Surgery | Admitting: Plastic Surgery

## 2020-03-17 ENCOUNTER — Encounter (HOSPITAL_BASED_OUTPATIENT_CLINIC_OR_DEPARTMENT_OTHER)
Admission: RE | Disposition: A | Discharge status: Home / Self Care | Payer: Self-pay | Source: Home / Self Care | Attending: Plastic Surgery

## 2020-03-17 DIAGNOSIS — M1990 Unspecified osteoarthritis, unspecified site: Secondary | ICD-10-CM | POA: Diagnosis not present

## 2020-03-17 DIAGNOSIS — Z9884 Bariatric surgery status: Secondary | ICD-10-CM | POA: Insufficient documentation

## 2020-03-17 DIAGNOSIS — M793 Panniculitis, unspecified: Secondary | ICD-10-CM | POA: Diagnosis not present

## 2020-03-17 HISTORY — DX: Nausea with vomiting, unspecified: Z98.890

## 2020-03-17 HISTORY — DX: Family history of other specified conditions: Z84.89

## 2020-03-17 HISTORY — DX: Nausea with vomiting, unspecified: R11.2

## 2020-03-17 HISTORY — PX: PANNICULECTOMY: SHX5360

## 2020-03-17 SURGERY — PANNICULECTOMY
Anesthesia: General | Site: Abdomen

## 2020-03-17 MED ORDER — LIDOCAINE HCL (CARDIAC) PF 100 MG/5ML IV SOSY
PREFILLED_SYRINGE | INTRAVENOUS | Status: DC | PRN
  Administered 2020-03-17: 100 mg via INTRAVENOUS

## 2020-03-17 MED ORDER — HYDROMORPHONE HCL 1 MG/ML IJ SOLN
0.2500 mg | INTRAMUSCULAR | Status: DC | PRN
  Administered 2020-03-17 (×2): 0.5 mg via INTRAVENOUS

## 2020-03-17 MED ORDER — EPHEDRINE SULFATE-NACL 50-0.9 MG/10ML-% IV SOSY
PREFILLED_SYRINGE | INTRAVENOUS | Status: DC | PRN
  Administered 2020-03-17 (×2): 15 mg via INTRAVENOUS

## 2020-03-17 MED ORDER — KETOROLAC TROMETHAMINE 30 MG/ML IJ SOLN
30.0000 mg | Freq: Three times a day (TID) | INTRAMUSCULAR | Status: AC
  Administered 2020-03-17 – 2020-03-18 (×3): 30 mg via INTRAVENOUS
  Filled 2020-03-17 (×3): qty 1

## 2020-03-17 MED ORDER — CEFAZOLIN SODIUM-DEXTROSE 2-4 GM/100ML-% IV SOLN
INTRAVENOUS | Status: AC
  Filled 2020-03-17: qty 100

## 2020-03-17 MED ORDER — DEXAMETHASONE SODIUM PHOSPHATE 10 MG/ML IJ SOLN
INTRAMUSCULAR | Status: DC | PRN
  Administered 2020-03-17: 10 mg via INTRAVENOUS

## 2020-03-17 MED ORDER — MIDAZOLAM HCL 5 MG/5ML IJ SOLN
INTRAMUSCULAR | Status: DC | PRN
  Administered 2020-03-17: 2 mg via INTRAVENOUS

## 2020-03-17 MED ORDER — SODIUM CHLORIDE (PF) 0.9 % IJ SOLN
INTRAMUSCULAR | Status: AC
  Filled 2020-03-17: qty 20

## 2020-03-17 MED ORDER — HEPARIN SODIUM (PORCINE) 5000 UNIT/ML IJ SOLN
5000.0000 [IU] | Freq: Once | INTRAMUSCULAR | Status: AC
  Administered 2020-03-17: 5000 [IU] via SUBCUTANEOUS

## 2020-03-17 MED ORDER — KETOROLAC TROMETHAMINE 30 MG/ML IJ SOLN: 30.0000 mg | Freq: Once | INTRAMUSCULAR | Status: AC | PRN

## 2020-03-17 MED ORDER — OXYCODONE HCL 5 MG PO TABS
5.0000 mg | ORAL_TABLET | ORAL | Status: DC | PRN
  Administered 2020-03-17: 5 mg via ORAL
  Filled 2020-03-17: qty 1

## 2020-03-17 MED ORDER — PROPOFOL 10 MG/ML IV BOLUS
INTRAVENOUS | Status: DC | PRN
  Administered 2020-03-17: 160 mg via INTRAVENOUS

## 2020-03-17 MED ORDER — OXYCODONE HCL 5 MG PO TABS: 5.0000 mg | ORAL_TABLET | Freq: Once | ORAL | Status: DC | PRN

## 2020-03-17 MED ORDER — METOCLOPRAMIDE HCL 5 MG/ML IJ SOLN
INTRAMUSCULAR | Status: AC
  Filled 2020-03-17: qty 2

## 2020-03-17 MED ORDER — FENTANYL CITRATE (PF) 100 MCG/2ML IJ SOLN
INTRAMUSCULAR | Status: AC
  Filled 2020-03-17: qty 2

## 2020-03-17 MED ORDER — ROCURONIUM BROMIDE 10 MG/ML (PF) SYRINGE
PREFILLED_SYRINGE | INTRAVENOUS | Status: AC
  Filled 2020-03-17: qty 10

## 2020-03-17 MED ORDER — CEFAZOLIN SODIUM-DEXTROSE 2-4 GM/100ML-% IV SOLN
2.0000 g | INTRAVENOUS | Status: AC
  Administered 2020-03-17: 2 g via INTRAVENOUS

## 2020-03-17 MED ORDER — ONDANSETRON HCL 4 MG/2ML IJ SOLN
INTRAMUSCULAR | Status: AC
  Filled 2020-03-17: qty 2

## 2020-03-17 MED ORDER — DEXMEDETOMIDINE HCL 200 MCG/2ML IV SOLN
INTRAVENOUS | Status: DC | PRN
  Administered 2020-03-17 (×2): 8 ug via INTRAVENOUS

## 2020-03-17 MED ORDER — SUGAMMADEX SODIUM 200 MG/2ML IV SOLN
INTRAVENOUS | Status: DC | PRN
  Administered 2020-03-17: 200 mg via INTRAVENOUS

## 2020-03-17 MED ORDER — FENTANYL CITRATE (PF) 100 MCG/2ML IJ SOLN
INTRAMUSCULAR | Status: DC | PRN
  Administered 2020-03-17: 50 ug via INTRAVENOUS
  Administered 2020-03-17: 100 ug via INTRAVENOUS
  Administered 2020-03-17 (×2): 50 ug via INTRAVENOUS

## 2020-03-17 MED ORDER — KETOROLAC TROMETHAMINE 30 MG/ML IJ SOLN
INTRAMUSCULAR | Status: AC
  Filled 2020-03-17: qty 1

## 2020-03-17 MED ORDER — ENOXAPARIN SODIUM 40 MG/0.4ML ~~LOC~~ SOLN
40.0000 mg | SUBCUTANEOUS | Status: DC
  Administered 2020-03-18: 40 mg via SUBCUTANEOUS
  Filled 2020-03-17: qty 0.4

## 2020-03-17 MED ORDER — ROCURONIUM BROMIDE 100 MG/10ML IV SOLN
INTRAVENOUS | Status: DC | PRN
  Administered 2020-03-17: 50 mg via INTRAVENOUS
  Administered 2020-03-17: 10 mg via INTRAVENOUS
  Administered 2020-03-17: 30 mg via INTRAVENOUS

## 2020-03-17 MED ORDER — PANTOPRAZOLE SODIUM 40 MG PO TBEC: 40.0000 mg | DELAYED_RELEASE_TABLET | Freq: Every day | ORAL | Status: DC

## 2020-03-17 MED ORDER — HYDROMORPHONE HCL 1 MG/ML IJ SOLN: 0.5000 mg | INTRAMUSCULAR | Status: DC | PRN

## 2020-03-17 MED ORDER — OXYCODONE HCL 5 MG/5ML PO SOLN: 5.0000 mg | Freq: Once | ORAL | Status: DC | PRN

## 2020-03-17 MED ORDER — PROPOFOL 10 MG/ML IV BOLUS
INTRAVENOUS | Status: AC
  Filled 2020-03-17: qty 20

## 2020-03-17 MED ORDER — METOCLOPRAMIDE HCL 5 MG/ML IJ SOLN
INTRAMUSCULAR | Status: DC | PRN
  Administered 2020-03-17: 10 mg via INTRAVENOUS

## 2020-03-17 MED ORDER — BUPIVACAINE LIPOSOME 1.3 % IJ SUSP
INTRAMUSCULAR | Status: AC
  Filled 2020-03-17: qty 20

## 2020-03-17 MED ORDER — MIDAZOLAM HCL 2 MG/2ML IJ SOLN: 1.0000 mg | INTRAMUSCULAR | Status: DC | PRN

## 2020-03-17 MED ORDER — VENLAFAXINE HCL ER 150 MG PO CP24
150.0000 mg | ORAL_CAPSULE | Freq: Every day | ORAL | Status: DC
  Filled 2020-03-17: qty 1

## 2020-03-17 MED ORDER — CHLORHEXIDINE GLUCONATE CLOTH 2 % EX PADS: 6.0000 | MEDICATED_PAD | Freq: Once | CUTANEOUS | Status: DC

## 2020-03-17 MED ORDER — HEPARIN SODIUM (PORCINE) 5000 UNIT/ML IJ SOLN
INTRAMUSCULAR | Status: AC
  Filled 2020-03-17: qty 1

## 2020-03-17 MED ORDER — RIZATRIPTAN BENZOATE 10 MG PO TBDP
10.0000 mg | ORAL_TABLET | ORAL | Status: DC | PRN
  Filled 2020-03-17: qty 1

## 2020-03-17 MED ORDER — PREGABALIN 75 MG PO CAPS
75.0000 mg | ORAL_CAPSULE | Freq: Two times a day (BID) | ORAL | Status: DC
  Filled 2020-03-17 (×2): qty 1

## 2020-03-17 MED ORDER — TOPIRAMATE 100 MG PO TABS
100.0000 mg | ORAL_TABLET | Freq: Every day | ORAL | Status: DC
  Filled 2020-03-17: qty 1

## 2020-03-17 MED ORDER — ACETAMINOPHEN 500 MG PO TABS
ORAL_TABLET | ORAL | Status: AC
  Filled 2020-03-17: qty 2

## 2020-03-17 MED ORDER — TIZANIDINE HCL 4 MG PO TABS
4.0000 mg | ORAL_TABLET | Freq: Three times a day (TID) | ORAL | Status: DC | PRN
  Filled 2020-03-17: qty 1

## 2020-03-17 MED ORDER — ONDANSETRON HCL 4 MG/2ML IJ SOLN: 4.0000 mg | Freq: Once | INTRAMUSCULAR | Status: DC | PRN

## 2020-03-17 MED ORDER — HYDROMORPHONE HCL 1 MG/ML IJ SOLN
INTRAMUSCULAR | Status: AC
  Filled 2020-03-17: qty 0.5

## 2020-03-17 MED ORDER — DEXAMETHASONE SODIUM PHOSPHATE 10 MG/ML IJ SOLN
INTRAMUSCULAR | Status: AC
  Filled 2020-03-17: qty 1

## 2020-03-17 MED ORDER — GABAPENTIN 300 MG PO CAPS
300.0000 mg | ORAL_CAPSULE | ORAL | Status: AC
  Administered 2020-03-17: 300 mg via ORAL

## 2020-03-17 MED ORDER — CELECOXIB 200 MG PO CAPS
200.0000 mg | ORAL_CAPSULE | ORAL | Status: AC
  Administered 2020-03-17: 200 mg via ORAL

## 2020-03-17 MED ORDER — ONDANSETRON 4 MG PO TBDP: 4.0000 mg | ORAL_TABLET | Freq: Four times a day (QID) | ORAL | Status: DC | PRN

## 2020-03-17 MED ORDER — BUPIVACAINE HCL (PF) 0.5 % IJ SOLN
INTRAMUSCULAR | Status: AC
  Filled 2020-03-17: qty 30

## 2020-03-17 MED ORDER — 0.9 % SODIUM CHLORIDE (POUR BTL) OPTIME
TOPICAL | Status: DC | PRN
  Administered 2020-03-17: 1000 mL

## 2020-03-17 MED ORDER — LACTATED RINGERS IV SOLN: INTRAVENOUS | Status: DC | PRN

## 2020-03-17 MED ORDER — FENTANYL CITRATE (PF) 100 MCG/2ML IJ SOLN: 50.0000 ug | INTRAMUSCULAR | Status: DC | PRN

## 2020-03-17 MED ORDER — CELECOXIB 200 MG PO CAPS
ORAL_CAPSULE | ORAL | Status: AC
  Filled 2020-03-17: qty 1

## 2020-03-17 MED ORDER — SODIUM CHLORIDE 0.9 % IV SOLN
INTRAVENOUS | Status: DC | PRN
  Administered 2020-03-17: 40 mL

## 2020-03-17 MED ORDER — LACTATED RINGERS IV SOLN: INTRAVENOUS | Status: DC

## 2020-03-17 MED ORDER — ONDANSETRON HCL 4 MG/2ML IJ SOLN: 4.0000 mg | Freq: Four times a day (QID) | INTRAMUSCULAR | Status: DC | PRN

## 2020-03-17 MED ORDER — ALPRAZOLAM 0.25 MG PO TABS: 0.2500 mg | ORAL_TABLET | Freq: Every day | ORAL | Status: DC | PRN

## 2020-03-17 MED ORDER — SCOPOLAMINE 1 MG/3DAYS TD PT72
1.0000 | MEDICATED_PATCH | TRANSDERMAL | Status: DC
  Administered 2020-03-17: 1.5 mg via TRANSDERMAL

## 2020-03-17 MED ORDER — KCL IN DEXTROSE-NACL 20-5-0.45 MEQ/L-%-% IV SOLN
INTRAVENOUS | Status: DC
  Filled 2020-03-17: qty 1000

## 2020-03-17 MED ORDER — ONDANSETRON HCL 4 MG/2ML IJ SOLN
INTRAMUSCULAR | Status: DC | PRN
  Administered 2020-03-17 (×2): 4 mg via INTRAVENOUS

## 2020-03-17 MED ORDER — GABAPENTIN 300 MG PO CAPS
ORAL_CAPSULE | ORAL | Status: AC
  Filled 2020-03-17: qty 1

## 2020-03-17 MED ORDER — EPHEDRINE 5 MG/ML INJ
INTRAVENOUS | Status: AC
  Filled 2020-03-17: qty 10

## 2020-03-17 MED ORDER — ACETAMINOPHEN 500 MG PO TABS
1000.0000 mg | ORAL_TABLET | ORAL | Status: AC
  Administered 2020-03-17: 1000 mg via ORAL

## 2020-03-17 MED ORDER — HYDROXYZINE HCL 25 MG PO TABS
25.0000 mg | ORAL_TABLET | Freq: Every day | ORAL | Status: DC
  Administered 2020-03-17: 25 mg via ORAL
  Filled 2020-03-17: qty 1

## 2020-03-17 MED ORDER — MIDAZOLAM HCL 2 MG/2ML IJ SOLN
INTRAMUSCULAR | Status: AC
  Filled 2020-03-17: qty 2

## 2020-03-17 MED ORDER — LIDOCAINE 2% (20 MG/ML) 5 ML SYRINGE
INTRAMUSCULAR | Status: AC
  Filled 2020-03-17: qty 5

## 2020-03-17 MED ORDER — SCOPOLAMINE 1 MG/3DAYS TD PT72
MEDICATED_PATCH | TRANSDERMAL | Status: AC
  Filled 2020-03-17: qty 1

## 2020-03-17 SURGICAL SUPPLY — 61 items
ADH SKN CLS APL DERMABOND .7 (GAUZE/BANDAGES/DRESSINGS) ×3
APL PRP STRL LF DISP 70% ISPRP (MISCELLANEOUS) ×1
APPLIER CLIP 9.375 MED OPEN (MISCELLANEOUS) ×2
APR CLP MED 9.3 20 MLT OPN (MISCELLANEOUS) ×1
BINDER ABDOMINAL 10 UNV 27-48 (MISCELLANEOUS) IMPLANT
BINDER ABDOMINAL 12 SM 30-45 (SOFTGOODS) IMPLANT
BLADE CLIPPER SURG (BLADE) ×1 IMPLANT
BLADE SURG 10 STRL SS (BLADE) ×4 IMPLANT
BLADE SURG 11 STRL SS (BLADE) ×2 IMPLANT
BLADE SURG 15 STRL LF DISP TIS (BLADE) IMPLANT
BLADE SURG 15 STRL SS (BLADE) ×2
CANISTER SUCT 1200ML W/VALVE (MISCELLANEOUS) ×2 IMPLANT
CHLORAPREP W/TINT 26 (MISCELLANEOUS) ×2 IMPLANT
CLIP APPLIE 9.375 MED OPEN (MISCELLANEOUS) ×1 IMPLANT
COVER BACK TABLE 60X90IN (DRAPES) ×2 IMPLANT
COVER MAYO STAND STRL (DRAPES) ×2 IMPLANT
COVER WAND RF STERILE (DRAPES) IMPLANT
DERMABOND ADVANCED (GAUZE/BANDAGES/DRESSINGS) ×3
DERMABOND ADVANCED .7 DNX12 (GAUZE/BANDAGES/DRESSINGS) ×2 IMPLANT
DRAIN CHANNEL 15F RND FF W/TCR (WOUND CARE) ×4 IMPLANT
DRAIN CHANNEL 19F RND (DRAIN) IMPLANT
DRAPE TOP ARMCOVERS (MISCELLANEOUS) ×2 IMPLANT
DRAPE U-SHAPE 76X120 STRL (DRAPES) ×2 IMPLANT
DRAPE UTILITY XL STRL (DRAPES) ×2 IMPLANT
DRSG PAD ABDOMINAL 8X10 ST (GAUZE/BANDAGES/DRESSINGS) ×6 IMPLANT
ELECT BLADE 4.0 EZ CLEAN MEGAD (MISCELLANEOUS)
ELECT COATED BLADE 2.86 ST (ELECTRODE) IMPLANT
ELECT REM PT RETURN 9FT ADLT (ELECTROSURGICAL) ×2
ELECTRODE BLDE 4.0 EZ CLN MEGD (MISCELLANEOUS) IMPLANT
ELECTRODE REM PT RTRN 9FT ADLT (ELECTROSURGICAL) ×1 IMPLANT
EVACUATOR SILICONE 100CC (DRAIN) ×4 IMPLANT
GAUZE XEROFORM 1X8 LF (GAUZE/BANDAGES/DRESSINGS) IMPLANT
GLOVE BIO SURGEON STRL SZ 6 (GLOVE) ×6 IMPLANT
GOWN STRL REUS W/ TWL LRG LVL3 (GOWN DISPOSABLE) ×2 IMPLANT
GOWN STRL REUS W/TWL LRG LVL3 (GOWN DISPOSABLE) ×4
NDL HYPO 25X1 1.5 SAFETY (NEEDLE) IMPLANT
NEEDLE HYPO 25X1 1.5 SAFETY (NEEDLE) IMPLANT
NS IRRIG 1000ML POUR BTL (IV SOLUTION) ×2 IMPLANT
PENCIL SMOKE EVACUATOR (MISCELLANEOUS) ×2 IMPLANT
PIN SAFETY STERILE (MISCELLANEOUS) ×2 IMPLANT
SET BASIN DAY SURGERY F.S. (CUSTOM PROCEDURE TRAY) ×2 IMPLANT
SHEET MEDIUM DRAPE 40X70 STRL (DRAPES) ×4 IMPLANT
SLEEVE SCD COMPRESS KNEE MED (MISCELLANEOUS) ×2 IMPLANT
SPONGE LAP 18X18 RF (DISPOSABLE) ×4 IMPLANT
STAPLER VISISTAT 35W (STAPLE) ×2 IMPLANT
SUT ETHILON 2 0 FS 18 (SUTURE) ×3 IMPLANT
SUT MNCRL AB 4-0 PS2 18 (SUTURE) ×7 IMPLANT
SUT PDS AB 0 CT 36 (SUTURE) ×4 IMPLANT
SUT PDS AB 2-0 CT2 27 (SUTURE) IMPLANT
SUT PLAIN 5 0 P 3 18 (SUTURE) ×1 IMPLANT
SUT VIC AB 2-0 CT1 27 (SUTURE)
SUT VIC AB 2-0 CT1 TAPERPNT 27 (SUTURE) IMPLANT
SUT VLOC 180 0 24IN GS25 (SUTURE) ×4 IMPLANT
SYR BULB IRRIG 60ML STRL (SYRINGE) ×2 IMPLANT
TOWEL GREEN STERILE FF (TOWEL DISPOSABLE) ×2 IMPLANT
TRAY FOL W/BAG SLVR 16FR STRL (SET/KITS/TRAYS/PACK) IMPLANT
TRAY FOLEY W/BAG SLVR 14FR LF (SET/KITS/TRAYS/PACK) IMPLANT
TRAY FOLEY W/BAG SLVR 16FR LF (SET/KITS/TRAYS/PACK)
TUBE CONNECTING 20X1/4 (TUBING) ×2 IMPLANT
UNDERPAD 30X36 HEAVY ABSORB (UNDERPADS AND DIAPERS) ×4 IMPLANT
YANKAUER SUCT BULB TIP NO VENT (SUCTIONS) ×2 IMPLANT

## 2020-03-17 NOTE — Op Note (Signed)
Operative Note   DATE OF OPERATION: 5.14.21  LOCATION: [facility]-outpatient  SURGICAL DIVISION: Plastic Surgery  PREOPERATIVE DIAGNOSES:  Panniculitis  POSTOPERATIVE DIAGNOSES:  same  PROCEDURE:  Panniculectomy  SURGEON: Glenna Fellows MD MBA  ASSISTANT: none  ANESTHESIA:  General.   EBL: 125 ml  COMPLICATIONS: None immediate.   INDICATIONS FOR PROCEDURE:  The patient, Tara Austin, is a 59 y.o. female born on [birth year], is here for panniculectomy for treatment recurrent intertrigo following bariatric surgery.   FINDINGS: 2872 g resection soft tissue.   DESCRIPTION OF PROCEDURE:  The patient was marked standing in the preoperative area to mark caudal incisionover abdomen 7 cm from labial fourchette and extended over lateral abdomen.SQ heparin administered. The patient was taken to the operating room. SCDs were placedand IV antibiotics were given. The patient's operative site was prepped and draped in a sterile fashion. A time out was performed and all information was confirmed to be correct.   Low transverseabdominalincision made and carried through superficial fascia to abdominal wall. Skin flap elevated in sub Scarpa's layer, taking care to leave layer of subfascial fat over abdominal wall fascia. Dissection completed toward umbilicus. Umbilicus sharply incised and scissor dissection completed to free from abdominal skin flap. Additional dissection completed in midline toward xiphoid. Wound irrigated and hemostasis obtained. Exparel infiltrated.15 Fr JP placed in subcutaneousright and leftabdomen and secured with 2-0 nylon. Diastasis recti imbricated with 0 V lock suture over both supra and infraumbilical abdomen. Patient then brought to semi sitting position.Caudal extent skin excision marked by palpation.Area marked excised.Superiorly based U shaped skin flap incised for delivery umbilicus.Low transverseabdominalskin incision closed with 0 PDS in  superficial fascia. 0 V lock used to closedermis and 4-0 monocryl for subcuticular skin closure. Umbilicus inset with 4-0 monocryl in dermis and 5-0 plain gut for skin closure. Xeroform bolster placed within umbilicus.Dermabond applied.  Dry dressing and abdominal binderapplied.The patient was allowed to wakefromanesthesia, extubatedand taken to the recovery room in satisfactory condition.  SPECIMENS: none  DRAINS: 15 Fr JP in right and left subcutaneous abdomen

## 2020-03-17 NOTE — Interval H&P Note (Signed)
History and Physical Interval Note:  03/17/2020 6:59 AM  Tara Austin  has presented today for surgery, with the diagnosis of Panniculitis.  The various methods of treatment have been discussed with the patient and family. After consideration of risks, benefits and other options for treatment, the patient has consented to  Procedure(s): PANNICULECTOMY (N/A) as a surgical intervention.  The patient's history has been reviewed, patient examined, no change in status, stable for surgery.  I have reviewed the patient's chart and labs.  Questions were answered to the patient's satisfaction.     Irean Hong Tara Austin

## 2020-03-17 NOTE — Transfer of Care (Signed)
Immediate Anesthesia Transfer of Care Note  Patient: Tara Austin  Procedure(s) Performed: ABDOMINAL PANNICULECTOMY (N/A Abdomen)  Patient Location: PACU  Anesthesia Type:General  Level of Consciousness: awake, alert  and oriented  Airway & Oxygen Therapy: Patient Spontanous Breathing and Patient connected to face mask oxygen  Post-op Assessment: Report given to RN and Post -op Vital signs reviewed and stable  Post vital signs: Reviewed and stable  Last Vitals:  Vitals Value Taken Time  BP 154/91 03/17/20 1119  Temp    Pulse 107 03/17/20 1123  Resp 15 03/17/20 1123  SpO2 100 % 03/17/20 1123  Vitals shown include unvalidated device data.  Last Pain:  Vitals:   03/17/20 0633  TempSrc: Tympanic  PainSc: 0-No pain         Complications: No apparent anesthesia complications

## 2020-03-17 NOTE — Anesthesia Procedure Notes (Signed)
Procedure Name: Intubation Date/Time: 03/17/2020 7:22 AM Performed by: [place] Shauntell Iglesia C, CRNA Pre-anesthesia Checklist: Patient identified, Emergency Drugs available, Suction available and Patient being monitored Patient Re-evaluated:Patient Re-evaluated prior to induction Oxygen Delivery Method: Circle system utilized Preoxygenation: Pre-oxygenation with 100% oxygen Induction Type: IV induction Ventilation: Mask ventilation without difficulty Laryngoscope Size: Mac and 3 Tube type: Oral Tube size: 7.0 mm Number of attempts: 1 Airway Equipment and Method: Stylet and Oral airway Placement Confirmation: ETT inserted through vocal cords under direct vision,  positive ETCO2 and breath sounds checked- equal and bilateral Secured at: 20 cm Tube secured with: Tape Dental Injury: Teeth and Oropharynx as per pre-operative assessment

## 2020-03-17 NOTE — Anesthesia Postprocedure Evaluation (Signed)
Anesthesia Post Note  Patient: Tara Austin  Procedure(s) Performed: ABDOMINAL PANNICULECTOMY (N/A Abdomen)     Patient location during evaluation: PACU Anesthesia Type: General Level of consciousness: awake and alert Pain management: pain level controlled Vital Signs Assessment: post-procedure vital signs reviewed and stable Respiratory status: spontaneous breathing, nonlabored ventilation, respiratory function stable and patient connected to nasal cannula oxygen Cardiovascular status: blood pressure returned to baseline and stable Postop Assessment: no apparent nausea or vomiting Anesthetic complications: no    Last Vitals:  Vitals:   03/17/20 1330 03/17/20 1424  BP:    Pulse: (!) 56 (!) 59  Resp:    Temp:    SpO2: 96% 96%    Last Pain:  Vitals:   03/17/20 1424  TempSrc:   PainSc: Asleep                 Trevor Iha

## 2020-03-18 DIAGNOSIS — M793 Panniculitis, unspecified: Secondary | ICD-10-CM | POA: Diagnosis not present

## 2020-03-18 NOTE — Discharge Instructions (Signed)

## 2020-03-18 NOTE — Discharge Summary (Signed)
Physician Discharge Summary  Patient ID: Tara Austin MRN: [id number] DOB/AGE: 02/21/1961 59 y.o.  Admit date: 03/17/2020 Discharge date: 03/18/2020  Admission Diagnoses: Panniculitis  Discharge Diagnoses:  Active Problems:   Panniculitis  Discharged Condition: stable  Hospital Course: Post operatively patient did well with pain controlled, tolerating diet, and ambulatory with minimal assist. Instructed on incisions and drains care, activity.  Treatments: surgery: panniculectomy 5.14.21  Discharge Exam: Blood pressure (!) 108/56, pulse 70, temperature 97.7 F (36.5 C), resp. rate 18, height 5\' 6"  (1.676 m), weight 87 kg, SpO2 99 %. Incision/Wound: incisions intact with scant drainage on dressings, drains seosanguinous, umbilicus viable, abdomen soft no hematoma  Disposition: Discharge disposition: 01-Home or Self Care       Discharge Instructions    Call MD for:  redness, tenderness, or signs of infection (pain, swelling, bleeding, redness, odor or green/yellow discharge around incision site)   Complete by: As directed    Call MD for:  temperature >100.5   Complete by: As directed    Discharge instructions   Complete by: As directed    Ok to remove dressings and shower am 5.16.21. Soap and water ok, pat incisions dry. No creams or ointments over incisions. Do not let drains dangle in shower, attach to lanyard or similar.Strip and record drains twice daily and bring log to clinic visit.  Abdominal binder all other times. Sleep with pillow beneath knees and 2-3 pillows behind head. Ambulate bent at hip.   Patient received pain Rx prior to surgery. Also ok to use Tylenol for pain.   No house yard work or exercise until cleared by MD.   Driving Restrictions   Complete by: As directed    No driving while taking narcotics   Lifting restrictions   Complete by: As directed    No lifting > 5 lbs until cleared by MD   Resume previous diet   Complete by: As directed      Allergies as of 03/18/2020      Reactions   Codeine    Contrast Media [iodinated Diagnostic Agents] Hives   Latex Hives      Medication List    TAKE these medications   ALPRAZolam 0.25 MG tablet Commonly known as: XANAX Take 0.25 mg by mouth daily as needed for anxiety.   Effexor XR 150 MG 24 hr capsule Generic drug: venlafaxine XR Take 150 mg by mouth daily with breakfast.   hydrOXYzine 25 MG tablet Commonly known as: ATARAX/VISTARIL Take 25 mg by mouth at bedtime.   pantoprazole 40 MG tablet Commonly known as: PROTONIX Take 40 mg by mouth daily.   pregabalin 75 MG capsule Commonly known as: LYRICA Take 75 mg by mouth 2 (two) times daily.   rizatriptan 10 MG disintegrating tablet Commonly known as: MAXALT-MLT Take 1 tablet (10 mg total) by mouth as needed for migraine. May repeat in 2 hours if needed   tiZANidine 4 MG tablet Commonly known as: ZANAFLEX Take 4 mg by mouth every 8 (eight) hours as needed for muscle spasms.   topiramate 100 MG tablet Commonly known as: TOPAMAX Take 100 mg by mouth daily.   Vitamin D (Ergocalciferol) 1.25 MG (50000 UNIT) Caps capsule Commonly known as: DRISDOL Take 50,000 Units by mouth every 7 (seven) days.      Follow-up Information    Irene Limbo, MD In 1 week.   Specialty: Plastic Surgery Why: as scheduled Contact information: [address] [place] [address] [phone]  SignedGlenna Fellows 03/18/2020, 8:26 AM

## 2020-03-21 ENCOUNTER — Encounter: Payer: Self-pay | Admitting: *Deleted

## 2020-08-31 ENCOUNTER — Inpatient Hospital Stay: Admission: RE | Admit: 2020-08-31 | Payer: Medicare Other | Source: Ambulatory Visit

## 2020-10-06 ENCOUNTER — Other Ambulatory Visit: Payer: Self-pay

## 2020-10-06 ENCOUNTER — Ambulatory Visit
Admission: RE | Admit: 2020-10-06 | Discharge: 2020-10-06 | Disposition: A | Discharge status: Home / Self Care | Payer: Medicare Other | Source: Ambulatory Visit | Attending: Obstetrics and Gynecology | Admitting: Obstetrics and Gynecology

## 2020-10-06 DIAGNOSIS — N6489 Other specified disorders of breast: Secondary | ICD-10-CM

## 2021-08-23 IMAGING — MG MM DIGITAL DIAGNOSTIC UNILAT*L* W/ TOMO W/ CAD
4 series · 4 of 12 positions shown · non-contrast
Comparison: Previous exam(s).

CLINICAL DATA: Six-month interval follow-up of likely benign
asymmetries in the LEFT breast.

EXAM:
DIGITAL DIAGNOSTIC UNILATERAL LEFT MAMMOGRAM WITH TOMO AND CAD

[L MLO synth-2D]
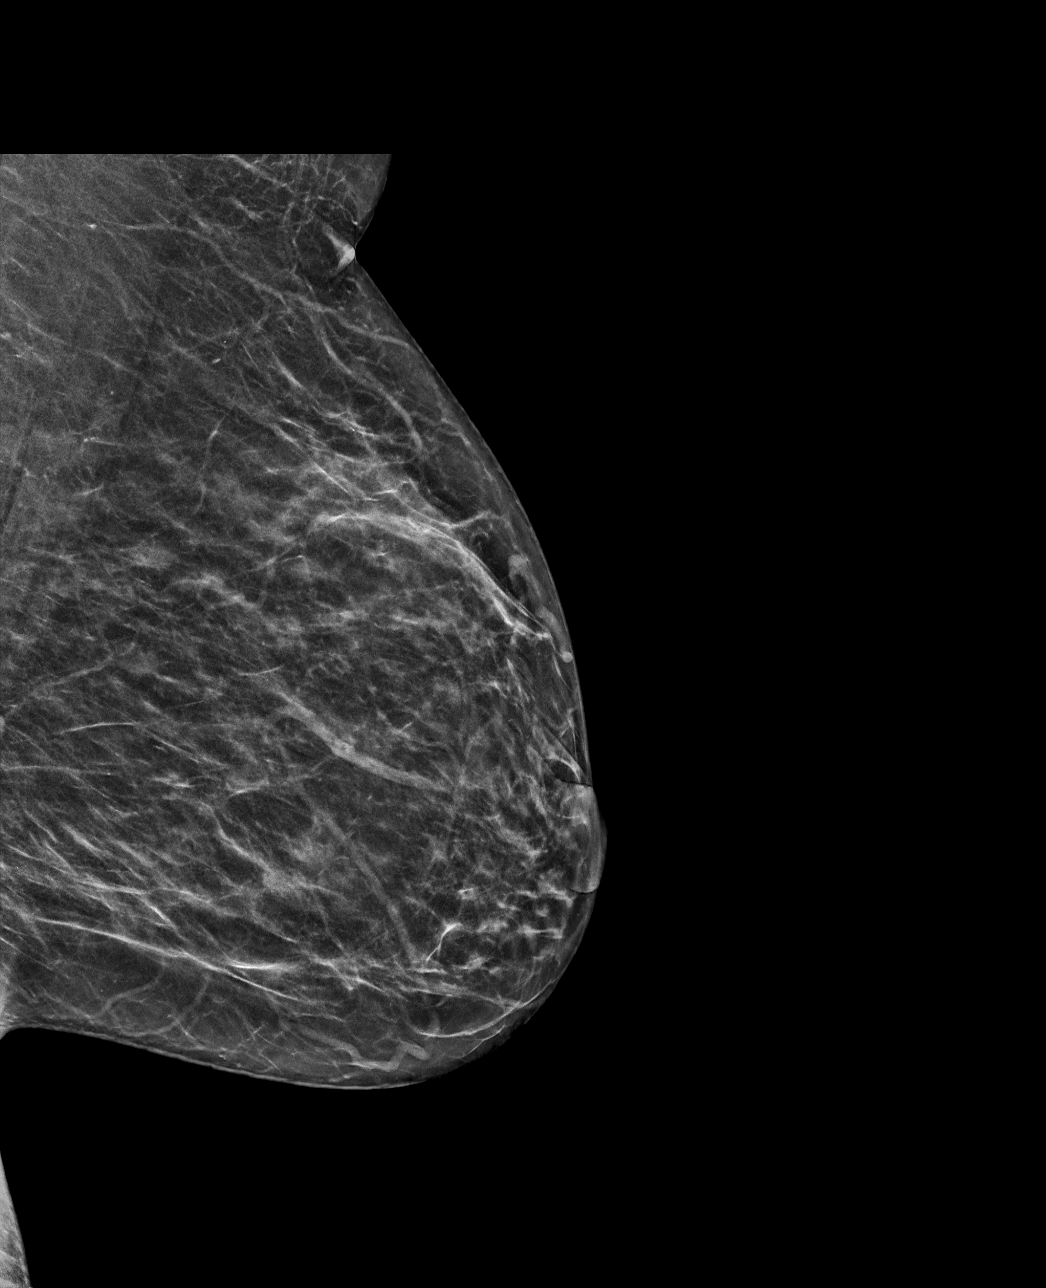

[L CC synth-2D]
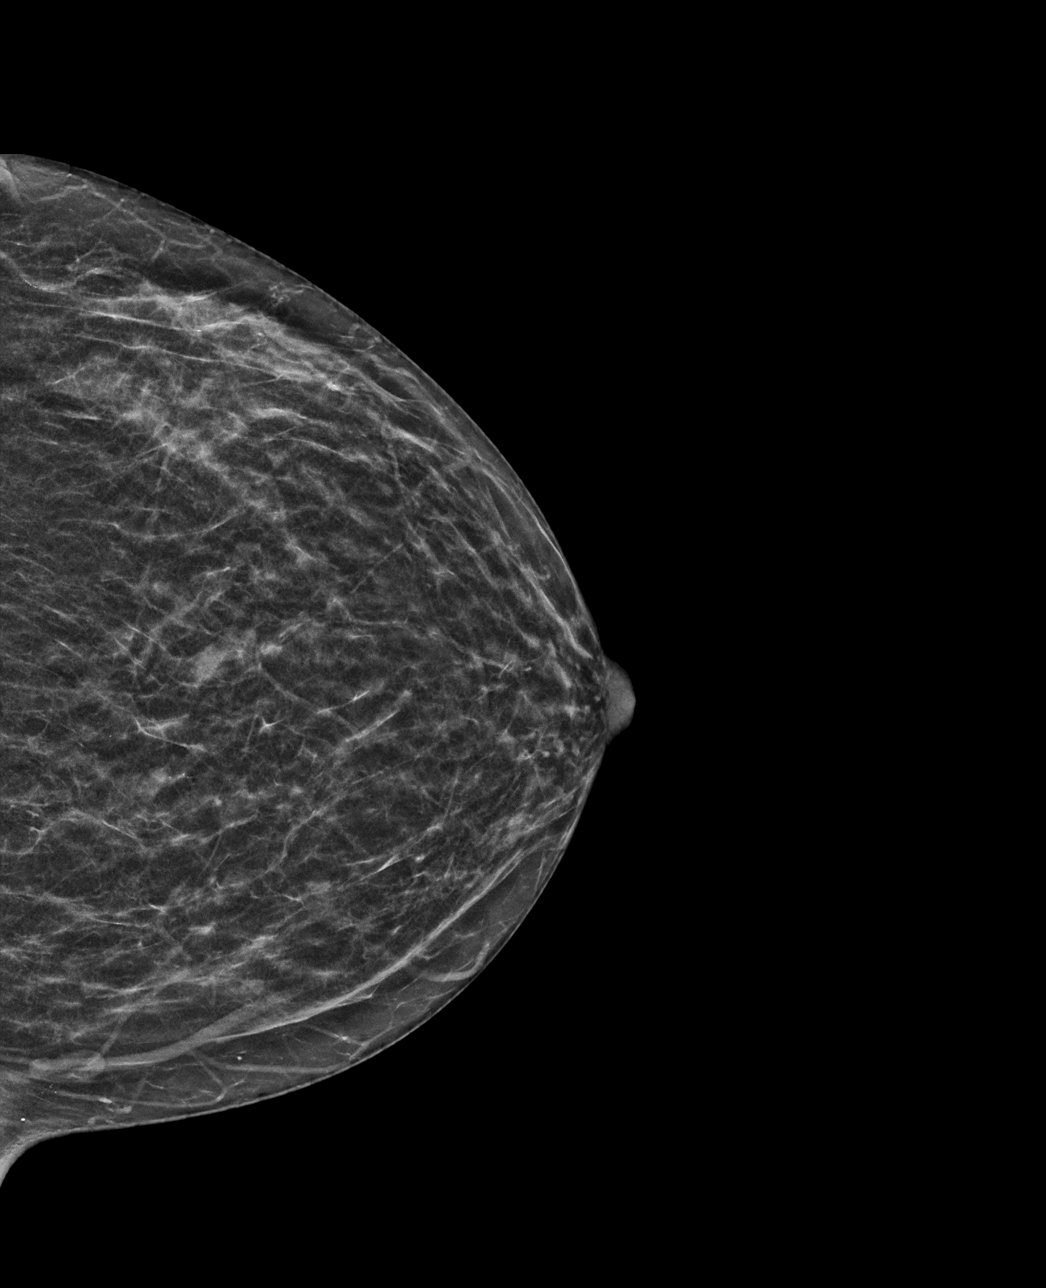

[L MLO tomo · tomo slice 29/56.0]
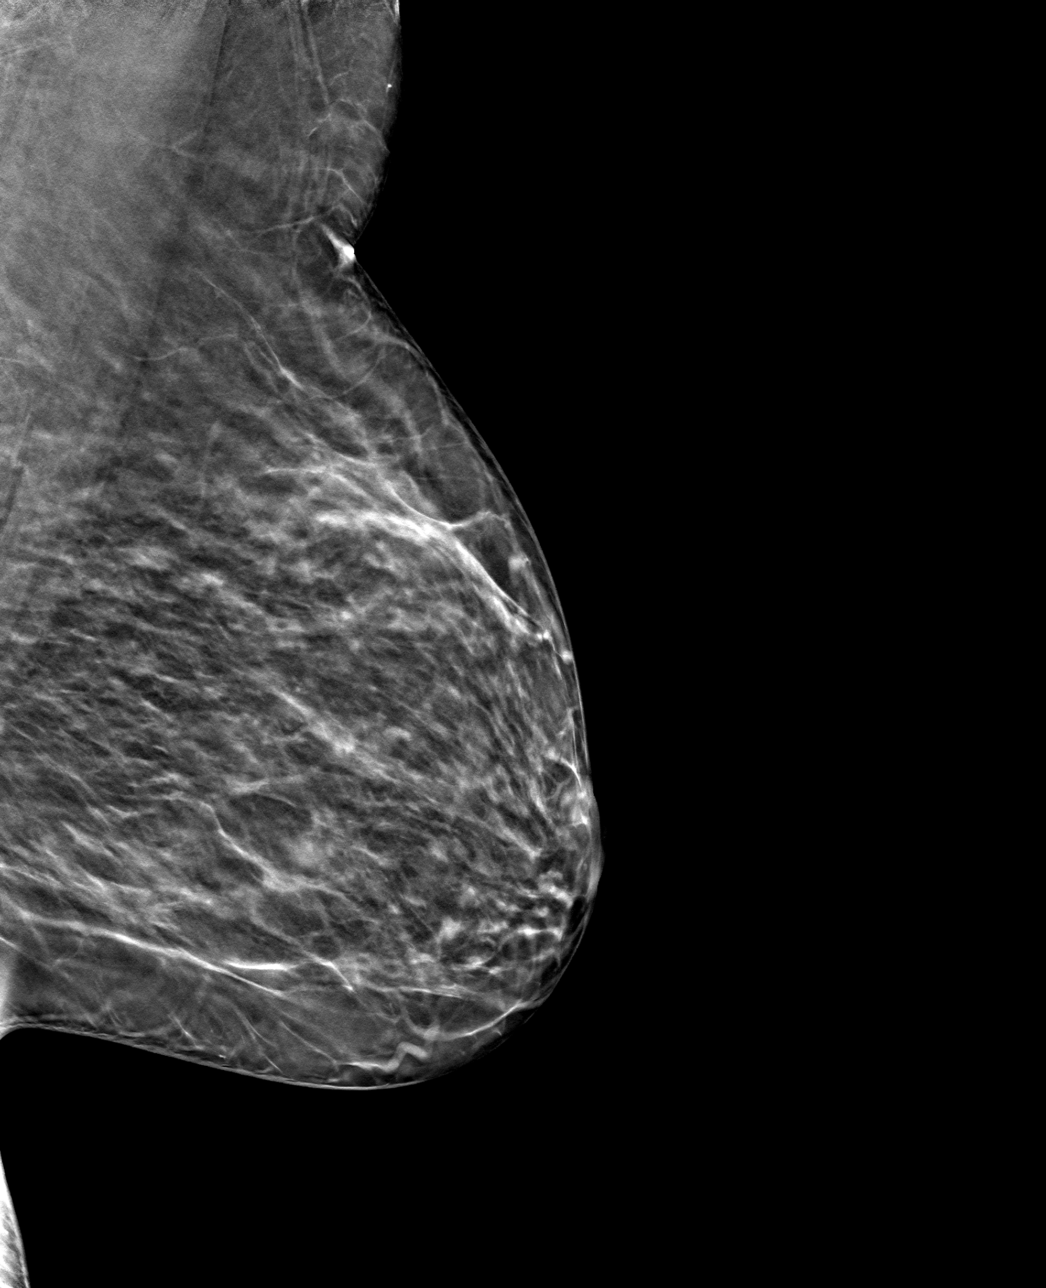

[L CC tomo · tomo slice 23/45.0]
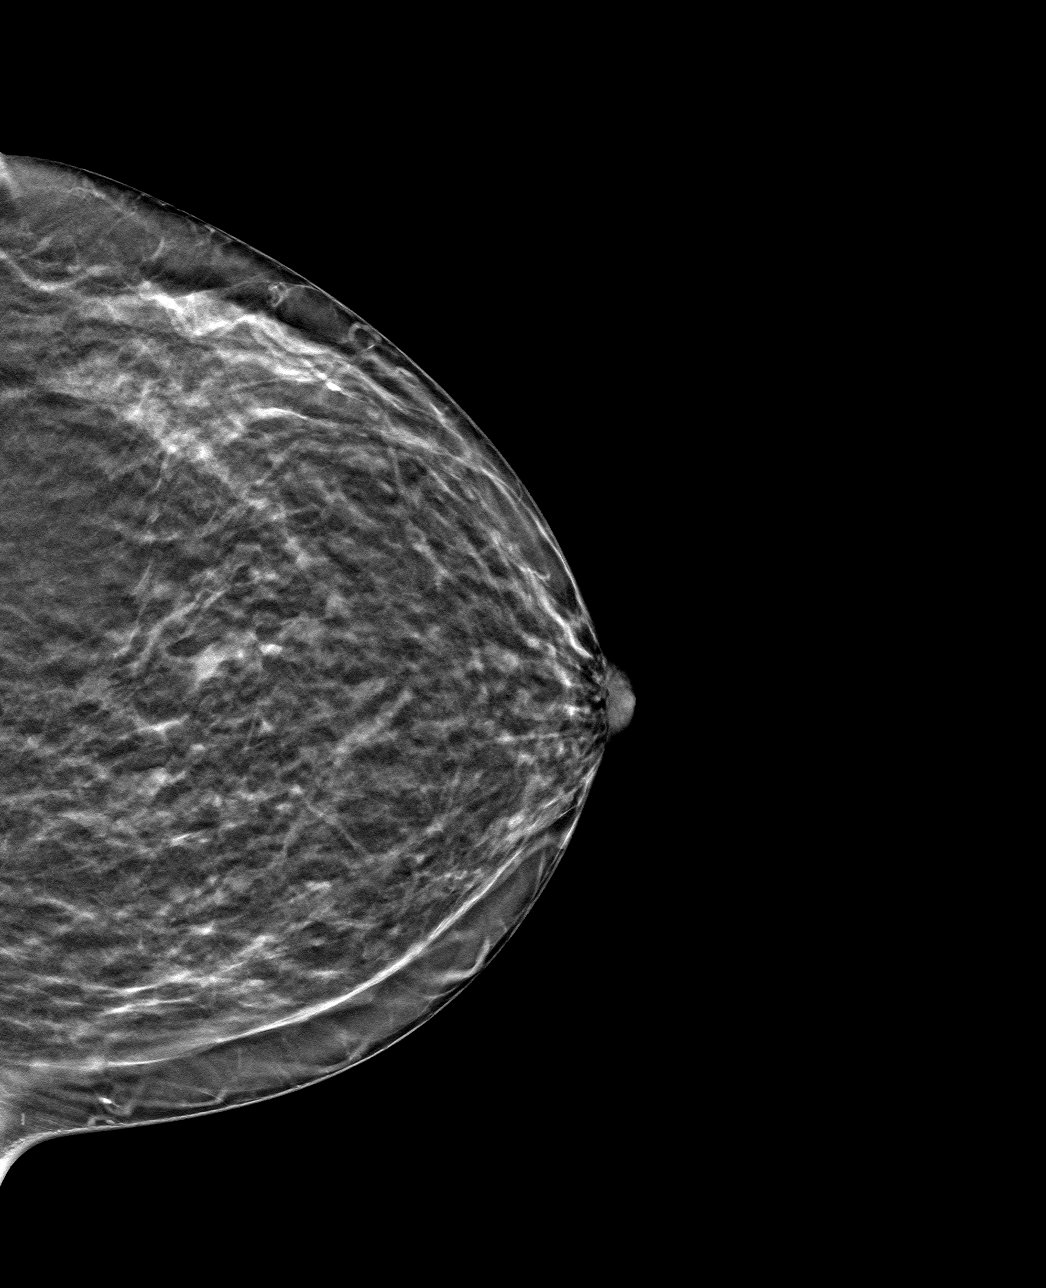

[4 of 12 positions shown; findings below may reference images not displayed]

ACR Breast Density Category b: There are scattered areas of
fibroglandular density.
FINDINGS: Tomosynthesis and synthesized digital 2D full field CC and MLO views
of the LEFT breast were obtained. Mammographic images were processed
with CAD.

The asymmetries questioned in the UPPER LEFT breast at MIDDLE to
POSTERIOR depth and in the LOWER INNER LEFT breast at MIDDLE depth
are less conspicuous on today's examination, and have the appearance
of normal fibroglandular tissue on today's tomosynthesis images. No
findings suspicious for malignancy.
IMPRESSION: No mammographic evidence of malignancy involving the LEFT breast.

RECOMMENDATION:
Annual BILATERAL screening mammography which is due in late
December 2020.

I have discussed the findings and recommendations with the patient.
If applicable, a reminder letter will be sent to the patient
regarding the next appointment.

BI-RADS CATEGORY  1: Negative.
